# Patient Record
Sex: Female | Born: 1995 | Race: White | Hispanic: No | Marital: Single | Smoking: Never smoker
Health system: Southern US, Community
[De-identification: ages and names within clinical notes are randomized; demographics above are authoritative.]

## PROBLEM LIST (undated history)

## (undated) DIAGNOSIS — R41840 Attention and concentration deficit: Secondary | ICD-10-CM

## (undated) DIAGNOSIS — N946 Dysmenorrhea, unspecified: Secondary | ICD-10-CM

## (undated) DIAGNOSIS — Z8669 Personal history of other diseases of the nervous system and sense organs: Secondary | ICD-10-CM

## (undated) DIAGNOSIS — Z8619 Personal history of other infectious and parasitic diseases: Secondary | ICD-10-CM

## (undated) DIAGNOSIS — N760 Acute vaginitis: Secondary | ICD-10-CM

## (undated) DIAGNOSIS — F419 Anxiety disorder, unspecified: Secondary | ICD-10-CM

## (undated) DIAGNOSIS — F4323 Adjustment disorder with mixed anxiety and depressed mood: Secondary | ICD-10-CM

## (undated) HISTORY — DX: Personal history of other infectious and parasitic diseases: Z86.19

## (undated) HISTORY — DX: Adjustment disorder with mixed anxiety and depressed mood: F43.23

## (undated) HISTORY — DX: Personal history of other diseases of the nervous system and sense organs: Z86.69

## (undated) HISTORY — DX: Acute vaginitis: N76.0

## (undated) HISTORY — DX: Anxiety disorder, unspecified: F41.9

## (undated) HISTORY — DX: Attention and concentration deficit: R41.840

## (undated) HISTORY — DX: Dysmenorrhea, unspecified: N94.6

---
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
## 2013-10-14 ENCOUNTER — Encounter (HOSPITAL_COMMUNITY): Payer: Self-pay | Admitting: Psychiatry

## 2013-10-14 ENCOUNTER — Encounter (INDEPENDENT_AMBULATORY_CARE_PROVIDER_SITE_OTHER): Payer: Self-pay

## 2013-10-14 ENCOUNTER — Ambulatory Visit (INDEPENDENT_AMBULATORY_CARE_PROVIDER_SITE_OTHER): Payer: 59 | Admitting: Psychiatry

## 2013-10-14 VITALS — BP 107/73 | HR 89 | Ht 61.0 in | Wt 119.0 lb

## 2013-10-14 DIAGNOSIS — F411 Generalized anxiety disorder: Secondary | ICD-10-CM

## 2013-10-14 MED ORDER — VENLAFAXINE HCL ER 37.5 MG PO CP24: ORAL_CAPSULE | ORAL | Status: DC
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
## 2013-10-17 ENCOUNTER — Ambulatory Visit (INDEPENDENT_AMBULATORY_CARE_PROVIDER_SITE_OTHER): Payer: 59 | Admitting: Licensed Clinical Social Worker

## 2013-10-17 ENCOUNTER — Encounter (HOSPITAL_COMMUNITY): Payer: Self-pay | Admitting: Licensed Clinical Social Worker

## 2013-10-17 DIAGNOSIS — F411 Generalized anxiety disorder: Secondary | ICD-10-CM
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
## 2013-10-24 ENCOUNTER — Ambulatory Visit (INDEPENDENT_AMBULATORY_CARE_PROVIDER_SITE_OTHER): Payer: 59 | Admitting: Licensed Clinical Social Worker

## 2013-10-24 DIAGNOSIS — F411 Generalized anxiety disorder: Secondary | ICD-10-CM

## 2013-10-24 NOTE — Progress Notes (Signed)
   THERAPIST PROGRESS NOTE   THERAPIST PROGRESS NOTE   Date of Service:   10/24/2013  Session Time: 9:04AM-9:57AM  Patient:   Hannah Castro   DOB:   28-Aug-1995  MR Number:  [id number]  Location:  [facility] BEHAVIORAL HEALTH OUTPATIENT THERAPY [place] [id number] [place] [address] Dept: [phone]            Provider/Observer:  Mindi Curling Rithwik Schmieg Clinical Social Work  Risk of Suicide/Violence: low Patient denies SI with no intent or plan  Diagnosis:    Generalized anxiety disorder  Type of Therapy: Individual Therapy  Treatment Goals addressed: Anxiety  Participation Level: Active    Behavioral Observation: Hannah Castro  presents as a 18 y.o.-year-old who presents to therapy for symptoms of depression. [name] was neat and well groomed and made good eye contact throughout the session. She was drinking coffee and smiled as appropriate. Patients tone was steady in volume and pitch. Her behavior was congruent with her speech.   Interventions: Motivational Interviewing, Strength-based and Supportive Reviewed and signed treatment plan  Behavioral Response: Neat and Well GroomedAlertAnxious   Summary: patient discussed updates since previous sessions, relationship with family members, and goals. Patient reviewed and signed her treatment plan and reported that she may want to update it later. LCSW agreed. Patient discussed family relationships/dynamics and how it impacts her anxiety. Patient reported that she has had a "good few days" and she is hopeful that things will improve. Patient discussed her motivation for getting treatment and how she feels that she will make slow progress, but she will reach her goals. Patient reported that she would like to continue weekly sessions until she is more comfortable. LCSW listened actively, made reflective statements, and provided positive feedback as appropriate. LCSW signed the treatment plan and  reminded the patient that it is a fluid document and can be updated and reviewed as needed. LCSW assesses for SI and patient reported no current/recent thoughts of harming herself and reports that she has had a "pretty good" week.     Plan: Hannah Castro will write her "pet peeves" and triggers that lead to anxiety over the next week and will return to the session next week as scheduled to engage in therapy. Patient will take medication as prescribed by her physician.   Return again in 1 weeks.     Genice Rouge, [place] 10/24/2013

## 2013-10-31 ENCOUNTER — Ambulatory Visit (INDEPENDENT_AMBULATORY_CARE_PROVIDER_SITE_OTHER): Payer: 59 | Admitting: Licensed Clinical Social Worker

## 2013-10-31 DIAGNOSIS — F411 Generalized anxiety disorder: Secondary | ICD-10-CM

## 2013-10-31 NOTE — Progress Notes (Signed)
   THERAPIST PROGRESS NOTE  Session Time: 4:04PM-4:50PM  Participation Level: Active  Behavioral Response: Neat and Well GroomedAlertAnxious  Type of Therapy: Individual Therapy  Treatment Goals addressed: Anxiety Increase understanding of beliefs and messages that produce the worry and anxiety  Interventions: CBT and Motivational Interviewing  Summary: ALANYS GODINO is a 18 y.o. female who presents with symptoms of anxiety. Patient discussed her symptoms and presentation since the last session. Patient discussed her family and ongoing relationships that may be stressors in her life. Patient provided a list of her triggers and discussed the triggers of her anxiety and her reactions. Patient discussed her goals and how she would like to use her treatment to accomplish her goals to be able to reduce anxiety enough to be able to attend school next year and transfer to a four year university. Patient reports that she feels that she is adjusting to the medication well, and she is waiting for it to "kick in."  Suicidal/Homicidal: Nowithout intent/plan  Therapist Response: LCSW assessed the patients symptoms since her last session. LCSW assessed the patients motivation for treatment, listened actively and provided positive feedback as needed.   Plan: Return again in 1 weeks, take medications as prescribed, keep scheduled appointments.  Diagnosis: Axis I: Generalized Anxiety Disorder      Ronney Lion 10/31/2013

## 2013-10-31 NOTE — Progress Notes (Deleted)
Patient ID: Hannah Castro, female   DOB: 10/04/1995, 18 y.o.   MRN: [id number]

## 2013-11-11 ENCOUNTER — Encounter (HOSPITAL_COMMUNITY): Payer: Self-pay | Admitting: Psychiatry

## 2013-11-11 ENCOUNTER — Ambulatory Visit (INDEPENDENT_AMBULATORY_CARE_PROVIDER_SITE_OTHER): Payer: 59 | Admitting: Psychiatry

## 2013-11-11 VITALS — BP 128/88 | HR 98 | Ht 61.0 in | Wt 120.0 lb

## 2013-11-11 DIAGNOSIS — F411 Generalized anxiety disorder: Secondary | ICD-10-CM

## 2013-11-11 MED ORDER — VENLAFAXINE HCL ER 150 MG PO CP24: 150.0000 mg | ORAL_CAPSULE | Freq: Every day | ORAL | Status: DC
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
## 2013-11-18 ENCOUNTER — Ambulatory Visit (INDEPENDENT_AMBULATORY_CARE_PROVIDER_SITE_OTHER): Payer: 59 | Admitting: Licensed Clinical Social Worker

## 2013-11-18 DIAGNOSIS — F411 Generalized anxiety disorder: Secondary | ICD-10-CM

## 2013-11-18 NOTE — Progress Notes (Signed)
   THERAPIST PROGRESS NOTE  Session Time: 9:10AM-10:05AM  Participation Level: Active  Behavioral Response: Neat and Well GroomedAlertAnxious  Type of Therapy: Individual Therapy  Treatment Goals addressed: Anxiety and Coping Reduce overall level, frequency, and intensity of the anxiety so that daily functioning is not impaired   Interventions: Motivational Interviewing, Strength-based and Supportive  Summary: Hannah Castro is a 18 y.o. female who presents with symptoms of anxiety . Patient discussed her recent medication adjustment and the difficulties experienced while adjusting. Patient reports that she was vomiting and she had a headache when her medication was adjusted, but she has not experienced any difficulties since then. Patient discussed the coping skills that were assigned as homework and discussed the effective relaxation strategies. Patient discussed her history of anxiety while attending school and discussed her goals to reduce anxiety to return to school. Patient reports that she feels that the medication has helped and she has experienced less anxiety since the last session. Patient reports that she has never been to the dentist and reports that she has insurance, but is anxious about making an appointment.  Patient discussed the pros and cons of going to the dentis and reports that she could listen to music as a coping skill. Patient denies SI/HI and psychosis.  Suicidal/Homicidal: Nowithout intent/plan  Therapist Response: Monitor medication compliance and effectiveness. Assisted Hannah AmassCorey in developing positive coping strategies. LCSW assessed patients current symptoms and reviewed the patients homework. LCSW listened actively and asked probing questions. LCSW assessed the use of coping skills since last session. LCSW made reflective statements in reference to the patients emotions. LCSW discussed the plans for next session and encouraged the patient to continue to use her coping  skills.  LCSW assigned the patient the homework of researching dental practices and scheduling an appointment.    Plan: Return again in 2 weeks. Continue to take medications as prescribed, keep scheduled appointments and complete homework as assigned.   Diagnosis: Generalized Anxiety Disorder        Hannah Castro, Hannah Rehfeldt M, LCSW 11/18/2013

## 2013-11-28 ENCOUNTER — Ambulatory Visit (INDEPENDENT_AMBULATORY_CARE_PROVIDER_SITE_OTHER): Payer: 59 | Admitting: Licensed Clinical Social Worker

## 2013-11-28 DIAGNOSIS — F411 Generalized anxiety disorder: Secondary | ICD-10-CM

## 2013-11-28 NOTE — Progress Notes (Signed)
   THERAPIST PROGRESS NOTE  Session Time: 1:03PM  - 2:08Pm  Participation Level: Active  Behavioral Response: Neat and Well GroomedAlertAnxious  Type of Therapy: Individual Therapy  Treatment Goals addressed: Anxiety  Interventions: CBT, Motivational Interviewing and Supportive  Summary: Hannah Castro is a 18 y.o. female who presents with symptoms of anxiety. Patient discussed her symptoms since the last session. Patient reports that he has not felt as anxious, but she has not been very social. Patient discussed her sister's wedding and reports that she was cordial with her brother. Patient discussed her making an appointment to the dentist. Patient reports that she feels anxious about the appointment but she feels that she will do well.  Patient discussed her relationship with her boyfriend and how he is adopting his nephew. Patient discussed her anxiety associated with being a mother figure at a young age. Patient discussed how she feels that she is slowly becoming adjusted to having a baby as a part of her life. Patient discussed the various coping skills that she has used and reports that she continues to do breathing exercises. Patient reports that the most difficult aspect of being anxious is when she feels anxious without cause. Patient reports that she will take note of those times moving forward to examine the patterns and discuss possible coping skills for those moments. .   Suicidal/Homicidal: NAwithout intent/plan  Therapist Response: LCSW assessed the patients symptoms. LCSW listened actively, asked probing questions, and provided positive feedback as needed. LCSW discussed the strain in the patients relationship with her brother and assessed her anxiety level at her sisters wedding.  LCSW discussed the patients anxiety level when making her dentist appointment and discussed the anticipating anxiety being that her appointment is one month away. LCSW asked probing questions when  patient was discussing her relationship with her boyfriend and her anxiety related to being with an 2911 month old. LCSW assessed the patients current level of functioning and use of her coping skills and possibility of new coping skills. LCSW provided the patient with the "Managing Anxiety" packet and asked the patient to complete the packet for homework.    Plan: Return again in 1 weeks. Keep scheduled appointments, continue taking medication as scheduled, complete Managing Anxiety packet to discuss in the next session.   Diagnosis:  Generalized Anxiety Disorder      Genice RougeHerbin, Massimiliano Rohleder M, LCSW 11/28/2013

## 2013-12-05 ENCOUNTER — Ambulatory Visit (INDEPENDENT_AMBULATORY_CARE_PROVIDER_SITE_OTHER): Payer: 59 | Admitting: Licensed Clinical Social Worker

## 2013-12-05 DIAGNOSIS — F411 Generalized anxiety disorder: Secondary | ICD-10-CM

## 2013-12-05 NOTE — Progress Notes (Signed)
THERAPIST PROGRESS NOTE  Session Time: 9:05AM-10:00AM  Participation Level: Active  Behavioral Response: Neat and Well GroomedAlertAnxious  Type of Therapy: Individual Therapy  Treatment Goals addressed: Anxiety and Coping  Interventions: CBT, Motivational Interviewing, Strength-based and Supportive  Summary: Hannah Castro is a 18 y.o. female who presents with symptoms of anxiety. Patient completed her omework and reviewed the "Managing Anxiety" packet. Patient reviewed the packet and discussed what she had learned. Patient reports that she struggles with self care and negative self talk. Patient reports that she can "come out of it" but it is has been identified as a struggle. Patient reports that she has not had much anxiety lately and has been using her coping skills, ut she has isolated herself. Patient reports that she often becomes so anxious that she has symptoms of depression, with no thoughts of self harm, but has not experienced these feelings of depression lately. Patient reports that she has continued to build a relationship with her boyfriend and his nephew and discussed the role her anxiety and depression plays in that growing relationship. Patient discussed her future goals for the relationship as well as her goals for school and work as they relate to her diagnosis. Patient reports that she would be open to her boyfriend attending a session to discuss their relationship moving forward as it relates to her anxiety and other goals. Patient discussed barriers to achieving some of theses goals and possible ways to overcome these barriers. Patient denies SI/HI and psychosis during the session .   Suicidal/Homicidal: Nowithout intent/plan  Therapist Response: LCSW assessed patients symptoms since last session. LCSW reviewed the patients homework and discussed the findings. LCSW discussed self care and positive self talk as it relates to anxiety and depression. LCSW discussed the  coping skills that have worked for the patient and discussed consistent use of coping skills. LCSW discussed the patients ongoing anxiety about going to the dentist for the first time and how coping skills can assist in easing that anxiety. LCSW listened actively to the patient discussing her relationships and goals. LCSW offered positive feedback as needed and encouraged the patient to set small attainable goals. LCSW discussed couples therapy with the patient and encouraged the patient to think of things that she would want to discuss in a group setting to bring to the next session. LCSW provided a self care wheel for homework for the patient to think, write, and review her self care goals for all areas of her life. LCSW provided the patient with an additional homework assignment to set small attainable goals to reach one of her larger goals,  To discuss during the next session.  LCSW encouraged the patient to continue to work on her goals and use her coping skills. LCSW encouraged the patient to set boundaries, but avoid isolation to avoid being overwhelmed in the future. LCSW discussed the importance of learning, instead of avoiding triggers in treatment, to learn the necessary coping skills.  LCSW discussed her schedule changing due [place] and that the patient will continue to see the LCSW in this office.   Plan: Return again in 2 weeks, continue to take medication as prescribed, keep scheduled appointmentments, use coping skills, bring list of items to discuss in the couples session, review self care sheet, complete small goal sheet to discuss in the next session.   Diagnosis:  Generalized Anxiety Disorder        Genice RougeHerbin, Siddhartha Hoback M, LCSW 12/05/2013

## 2013-12-13 ENCOUNTER — Other Ambulatory Visit (HOSPITAL_COMMUNITY): Payer: Self-pay | Admitting: Psychiatry

## 2013-12-14 ENCOUNTER — Other Ambulatory Visit (HOSPITAL_COMMUNITY): Payer: Self-pay | Admitting: Psychiatry

## 2013-12-14 NOTE — Telephone Encounter (Signed)
Given on 11/11/13 with one refill. Too soon to refill.

## 2013-12-19 ENCOUNTER — Ambulatory Visit (INDEPENDENT_AMBULATORY_CARE_PROVIDER_SITE_OTHER): Payer: 59 | Admitting: Licensed Clinical Social Worker

## 2013-12-19 DIAGNOSIS — F411 Generalized anxiety disorder: Secondary | ICD-10-CM

## 2013-12-19 NOTE — Progress Notes (Signed)
   THERAPIST PROGRESS NOTE  Session Time: 9:07AM - 10:00AM  Participation Level: Active  Behavioral Response: Neat and Well GroomedAlertAnxious  Type of Therapy: Individual Therapy  Treatment Goals addressed: Anxiety and Coping  Interventions: Motivational Interviewing, Strength-based and Supportive  Summary: Hannah Castro is a 18 y.o. female who presents with symptoms of anxiety. Patient discussed her present symptoms and reports that she has noticed that she has difficulty holding conversations with others. Patient reprorts that she has heart palpitations and her anxiety increases when she has to engage in conversations with others. Patient reports that she has noticed an increase in this over the past few months. Patient discussed various things that she would like to address with her boyfriend in a joint session. Patient discussed the importance of using "I statements" and practiced some of those statements in the session.  Patient was assigned homework to rate her anxiety and track her conversations with others until next visit and also to practice writing "I statements" to review in the next session. Patient denies SI/HI and psychosis during the session.   .   Suicidal/Homicidal: Nowithout intent/plan   Therapist Response: LCSW assessed the patients symptoms and asked patient for her homework that was assigned during the last session. LCSW assessed the patients anxiety level and the most recent issues. LCSW listened actively and provided positive feedback as needed. LCSW asked probing questions and gained insight into the patients current problems. LCSW discussed the patients boyfriend attending a session and asked about the expectations of the session. LCSW informed the patient that the sessions will be planned and goal oriented. The LCSW assigned patient homework to practice writing "I statements" to address her concerns with her boyfriend during the next session.  LCSW assigned the  patient homework to address the level of anxiety during conversations for the next session.    Plan: Return again in 2 weeks, take medications as prescribed, keep scheduled appointments, complete homework as assigned. .  Diagnosis:  Generalized Anxiety Disorder        Hannah RougeHerbin, Hannah Castro M, LCSW 12/19/2013

## 2014-01-02 ENCOUNTER — Other Ambulatory Visit (HOSPITAL_COMMUNITY): Payer: Self-pay | Admitting: Psychiatry

## 2014-01-02 ENCOUNTER — Ambulatory Visit (INDEPENDENT_AMBULATORY_CARE_PROVIDER_SITE_OTHER): Payer: 59 | Admitting: Licensed Clinical Social Worker

## 2014-01-02 DIAGNOSIS — F411 Generalized anxiety disorder: Secondary | ICD-10-CM

## 2014-01-02 NOTE — Progress Notes (Signed)
   THERAPIST PROGRESS NOTE  Session Time: 9:03am-9:56am  Participation Level: Active  Behavioral Response: Neat and Well GroomedAlertAnxious  Type of Therapy: Individual Therapy  Treatment Goals addressed: Anxiety and Communication: using "I" Statements  Interventions: CBT, Motivational Interviewing and Supportive  Summary: Hannah AmabileCorey E Castro is a 18 y.o. female who presents with symptoms of anxiety. Patient reports that she has been feeling less anxiety when communicating with others and reviewed her homework assignment. Patient reports that she has been able to communicate and have "small talk" which has increased her comfort in speaking with others. Patient discussed her homework related to "I statements" and role played using those "I statements" with her boyfriend. Patient discussed the topics that she would like to cover in the session with her boyfriend and how the various issues result in increased anxiety. Patient reports comfort using the "I statements" and reports that she feels that she is prepared to have Hannah Castro join in the next session. PAtient signed the appropriate releases. Patient reports that she is taking her medication as prescribed with no complaints. Patient reports that she went to her dentist appointments and she had good results, patient reports that she was anxious while in the waiting room, but grew comfortable once she was back in the office. Patient reports that she will see a dentist regularly moving forward.  Patient reports that she uses some coping skills, but has not been triggered as of late. Patient denies SI/HI and psychosis.    Suicidal/Homicidal: Nowithout intent/plan  Therapist Response: LCSW assessed the patients symptoms and reviewed homework. LCSW listened actively and asked probing questions regarding the patients anxiety level as it relates to communicating with others. LCSW reviewed and discussed the patients "I statements" and participated in role play.  LCSW reviewed the rules of family sessions and discussed the overall purpose of the session. LCSW encouraged the patient to establish a purpose for the family session and to have "I statements" that reflect that purpose in an effort to keep the conversation on the topic as it relates to her anxiety and recovery. LCSW encouraged the patient to use the coping skills regularly despite lack of triggers. LCSW inquired about the patients visit to the dentist and praised the patient for following through with the appointment with reduced anxiety. LCSW prepared the patient for a family session at the next appointment.    Plan: Return again in 2 weeks, use coping skills, prepare to use " I statements" in a family session, keep scheduled appointments and take medications as prescribed.   Diagnosis:  Generalized Anxiety Disorder        Ronney LionHerbin, Hannah Cavalieri M, LCSW 01/02/2014

## 2014-01-02 NOTE — Progress Notes (Deleted)
   THERAPIST PROGRESS NOTE  Session Time: ***  Participation Level: {BHH PARTICIPATION LEVEL:22264}  Behavioral Response: {Appearance:22683}{BHH LEVEL OF CONSCIOUSNESS:22305}{BHH MOOD:22306}  Type of Therapy: {CHL AMB BH Type of Therapy:21022741}  Treatment Goals addressed: {CHL AMB BH Treatment Goals Addressed:21022754}  Interventions: {CHL AMB BH Type of Intervention:21022753}  Summary: Hannah Castro is a 18 y.o. female who presents with ***.   Suicidal/Homicidal: {BHH YES OR NO:22294}{yes/no/with/without intent/plan:22693}  Therapist Response: ***  Plan: Return again in *** weeks.  Diagnosis: Axis I: {psych axis 1:31909}    Axis II: {psych axis 2:31910}    Genice RougeHerbin, Raechelle Sarti M, LCSW 01/02/2014

## 2014-01-07 ENCOUNTER — Other Ambulatory Visit (HOSPITAL_COMMUNITY): Payer: Self-pay | Admitting: Psychiatry

## 2014-01-09 ENCOUNTER — Other Ambulatory Visit (HOSPITAL_COMMUNITY): Payer: Self-pay | Admitting: Psychiatry

## 2014-01-09 NOTE — Telephone Encounter (Signed)
Patient had appointment in few days. Will wait

## 2014-01-12 ENCOUNTER — Encounter (HOSPITAL_COMMUNITY): Payer: Self-pay | Admitting: Psychiatry

## 2014-01-12 ENCOUNTER — Ambulatory Visit (INDEPENDENT_AMBULATORY_CARE_PROVIDER_SITE_OTHER): Payer: 59 | Admitting: Psychiatry

## 2014-01-12 VITALS — BP 109/71 | HR 103 | Ht 61.0 in | Wt 119.8 lb

## 2014-01-12 DIAGNOSIS — F411 Generalized anxiety disorder: Secondary | ICD-10-CM

## 2014-01-12 MED ORDER — VENLAFAXINE HCL ER 150 MG PO CP24: 150.0000 mg | ORAL_CAPSULE | Freq: Every day | ORAL | Status: DC
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
## 2014-01-13 ENCOUNTER — Ambulatory Visit (INDEPENDENT_AMBULATORY_CARE_PROVIDER_SITE_OTHER): Payer: 59 | Admitting: Licensed Clinical Social Worker

## 2014-01-13 DIAGNOSIS — F411 Generalized anxiety disorder: Secondary | ICD-10-CM

## 2014-01-13 NOTE — Progress Notes (Signed)
   THERAPIST PROGRESS NOTE  Session Time: 9:07AM-10:01AM   Participation Level: Active  Behavioral Response: Neat and Well GroomedAlertAnxious  Type of Therapy: Individual Therapy  Treatment Goals addressed: Anxiety, Communication: with her boyfriend and his family and Coping  Interventions: Motivational Interviewing, Strength-based and Supportive  Summary: Hannah Castro is a 18 y.o. female who presents with symptoms of anxiety. Patient was present with her boyfriend to discuss how adopting his nephew impacts her anxiety and discuss plans for moving forward. Patients boyfriend was attentive and encouraging. Patient discussed how gaining custody of her boyfriend's nephew contributes to her anxiety and discussed a plan for moving forward. Patient reports that she is much more co fident in their relationship and needs to prepare for going back to school. Patient's boyfriend reports that the joint session was beneficial for him and he is glad they had an opportunity to communicate with one another about a plan.  Patient denies SI/HI and psychosis.   Suicidal/Homicidal: Nowithout intent/plan  Therapist Response: LCSW assessed the patients symptoms and set the ground rules for the family session. LCSW asked the patient to express herself using the "I statements" practiced in previous settings. LCSW encouraged the patient and her boyfriend to be honest during the session. LCSW supported the patient in the session and summarized the session to ensure that both parties left with the same plan. LCSW checked in with the patient and her boyfriend before the session ended and encouraged more sessions in the future if needed to stabilize or reducethe patient's anxiety. LCSW provided teh patients with parenting books to review skills and make copies for their records.   Plan: Return again in 1 weeks to discuss the session with the patient only, continue to take medications as prescribed, use coping skills, and  keep scheduled appointments. .  Diagnosis: Generalized Anxiety Disorder   Hannah LionHerbin, Hannah Wahlert M, LCSW 01/13/2014

## 2014-01-27 ENCOUNTER — Ambulatory Visit (HOSPITAL_COMMUNITY): Payer: Self-pay | Admitting: Licensed Clinical Social Worker

## 2014-02-03 ENCOUNTER — Ambulatory Visit (INDEPENDENT_AMBULATORY_CARE_PROVIDER_SITE_OTHER): Payer: 59 | Admitting: Licensed Clinical Social Worker

## 2014-02-03 DIAGNOSIS — F411 Generalized anxiety disorder: Secondary | ICD-10-CM

## 2014-02-03 NOTE — Progress Notes (Signed)
   THERAPIST PROGRESS NOTE  Session Time: 2:00-2:55PM  Participation Level: Active  Behavioral Response: Neat and Well GroomedAlertAnxious  Type of Therapy: Individual Therapy  Treatment Goals addressed: Anxiety and Coping  Interventions: CBT, Motivational Interviewing, Strength-based and Supportive  Summary: Hannah Castro is a 18 y.o. female who presents with symptoms of anxiety. Patient reports that she has not had many symptoms of anxiety lately and has been taking her medications as prescribed. Patient reports that she has been doing well since the last session and has signed up for classes and is preparing for school. Patient discussed her anxiety related to returning to school and discussed the problems with anxiety that she encountered during her last semester at school. Patient discussed her preparation to return to school.  Patient discussed her plans to return to school and the skills that she would like to use to manage her anxiety. Patient reports that she would be ready to discuss moving to monthly sessions in preparation for discharge. Patient denies SI/HI and psychosis.    Suicidal/Homicidal: Nowithout intent/plan  Therapist Response: LCSW assessed patients current symptoms and compliance with medication. LCSW followed up with patient with topics discussed in the last session and asked if the family session impacted her relationship any. LCSW asked the patient about her anxiety experiences as they related to attending school and how her anxiety has impacted her education in the past. LCSW listened actively and asked probing questions to the patient and asked the patient what she feels that she could change moving forward into a new semester. LCSW asked the patient how she had prepared for returning to school and how this preparation differs from her previous preparations. LCSW discussed the pros and cons of transitioning to a four year university after finishing her associates  degree. LCSW discussed the options available to manage her anxiety.   Plan: Return again in 2 weeks, use coping skills,   Diagnosis:  Generalized Anxiety Disorder       Genice RougeHerbin, Eline Geng M, LCSW 02/03/2014

## 2014-02-17 ENCOUNTER — Ambulatory Visit (INDEPENDENT_AMBULATORY_CARE_PROVIDER_SITE_OTHER): Payer: 59 | Admitting: Licensed Clinical Social Worker

## 2014-02-17 DIAGNOSIS — F411 Generalized anxiety disorder: Secondary | ICD-10-CM

## 2014-02-17 NOTE — Progress Notes (Signed)
   THERAPIST PROGRESS NOTE  Session Time:9:00AM-9:55AM  Participation Level: Active  Behavioral Response: Neat and Well GroomedAlertAnxious  Type of Therapy: Individual Therapy  Treatment Goals addressed: Anxiety and Coping  Interventions: CBT, Motivational Interviewing, Strength-based and Supportive  Summary: Hannah Castro is a 18 y.o. female who presents with a history of anxiety. Patient reports that she has not had many symptoms of anxiety over the past two weeks and she has been taking her medications as prescribed. Patient reports that she completed her goal sheet and she does not feel as overwhelmed since she has broken her goal down into smaller steps. Patient discussed returning to school in two weeks and reports that she feels that she is prepared. Patient reports that she spoke with her advisor and she is planning to finish her associates over the summer and apply to [employer] for next year.  Patient reports that she has the disability paperwork printed out and would like to submit the paperwork to campus to reduce the amount of anxiety that she has while in school. Patient discussed her triggers while in school and taking the necessary precautions to avoid them.  Patient reports that her anxiety has been a "3" on a scale of 1-10 over the past week. Patient reports that she struggles with biting and picking her nails, and although her anxiety has reduced, she still has this habit. Patient reports that she feels that her anxiety has reduced dramatically. Patient requested that we meet in two weeks before going to monthly appointments. Patient denies SI/HI and psychosis.    Suicidal/Homicidal: Nowithout intent/plan  Therapist Response: LCSW assessed the patients symptoms and followed up on the patients homework assignment from last week.  LCSW asked patient about her compliance with medications. LCSW followed up with patient regarding her goals and being overwhelmed with reaching her goal of  returning to school while managing her anxiety. LCSW listened actively and provided positive feedback as needed.   LCSW followed up with patient regarding disability paperwork for this coming semester. LCSW discussed patient anxiety level and her goals as they relate to discharge. LCSW encouraged the patient to continue to use her skills to reduce her level of anxiety. LCSW informed patient that she will look into methods to assist with nail biting/picking. LCSW also encouraged patient to speak with her sister who has thad the same habit in the past.   Plan: Return again in 2 weeks.  Diagnosis:  Generalized Anxiety Disorder      Ronney LionHerbin, Dietrich Ke M, LCSW 02/17/2014

## 2014-03-02 ENCOUNTER — Encounter (HOSPITAL_COMMUNITY): Payer: Self-pay | Admitting: Licensed Clinical Social Worker

## 2014-03-06 ENCOUNTER — Ambulatory Visit (INDEPENDENT_AMBULATORY_CARE_PROVIDER_SITE_OTHER): Payer: 59 | Admitting: Licensed Clinical Social Worker

## 2014-03-06 DIAGNOSIS — F411 Generalized anxiety disorder: Secondary | ICD-10-CM

## 2014-03-06 NOTE — Progress Notes (Unsigned)
   THERAPIST PROGRESS NOTE  Session Time: 2:05PM-2:58PM  Participation Level: Active  Behavioral Response: Neat and Well GroomedAlertAnxious  Type of Therapy: Individual Therapy  Treatment Goals addressed: Anxiety and Coping  Interventions: CBT, Supportive and Reframing  Summary: Hannah Castro is a 19 y.o. female who presents with symptoms of anxiety. Patient discussed her symptoms and her transition to school. Patient reports that she feels that she is adjusting well and has decided not to submit the disability forms for school. 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 Patient expressed concerns about a baby that she knows. [place] who reported that they could not take that call and directed the patient to contact non-emergency. Patient contacted non-emergency, who reported that this was not an emergency and provided a phone number that the patient could report. Patient reports she is concerned about the way someone she knows had the baby in the car seat because she did not feel that it was secured correctly. Patient discussed her anxiety related to this and she talked through it. Patient reports that she feels that she will have to "sit" with feeling this way about the car seat and accept that she did all that she was able to do.  Patient reports that she will travel to meet the LCSW in the new location in one month. Patient denies SI/HI and psychosis.   Suicidal/Homicidal: Nowithout intent/plan  Therapist Response: LCSW assessed patient for symptoms and followed up with patient about starting school and disability. LCSW listened actively and encouraged patient to continue to talk things through to become less anxious. LCSW assisted patient in contacting DSS and non-emergency and discussed the patient's anxious feelings in the session. LCSW discussed how the patient could cope with  this instance and how it relates to other instances of coping with anxiety. LCSW informed patient that she would be switching locations and offered various options for continued care.    Plan: Return again in 4 weeks.  Diagnosis:  Generalized Anxiety Disorder      Ronney LionHerbin, Terrence Wishon M, LCSW 03/06/2014

## 2014-04-01 ENCOUNTER — Other Ambulatory Visit (HOSPITAL_COMMUNITY): Payer: Self-pay | Admitting: Psychiatry

## 2014-04-02 NOTE — Telephone Encounter (Signed)
Refill requested early. Will be filled at appointment 04/14/14.

## 2014-04-06 NOTE — Telephone Encounter (Signed)
Medication refill - Effexor XR last written 01/12/14 with 2 refills - appointment 04/14/14

## 2014-04-14 ENCOUNTER — Ambulatory Visit (INDEPENDENT_AMBULATORY_CARE_PROVIDER_SITE_OTHER): Payer: 59 | Admitting: Psychiatry

## 2014-04-14 ENCOUNTER — Encounter (HOSPITAL_COMMUNITY): Payer: Self-pay | Admitting: Psychiatry

## 2014-04-14 VITALS — BP 102/70 | HR 125 | Wt 114.6 lb

## 2014-04-14 DIAGNOSIS — F411 Generalized anxiety disorder: Secondary | ICD-10-CM

## 2014-04-14 MED ORDER — CLONAZEPAM 0.5 MG PO TABS: ORAL_TABLET | ORAL | Status: DC

## 2014-04-14 MED ORDER — CLONAZEPAM 0.5 MG PO TABS: 0.2500 mg | ORAL_TABLET | Freq: Every day | ORAL | Status: DC

## 2014-04-14 MED ORDER — VENLAFAXINE HCL ER 150 MG PO CP24: 150.0000 mg | ORAL_CAPSULE | Freq: Every day | ORAL | Status: DC
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
## 2014-04-24 ENCOUNTER — Ambulatory Visit (INDEPENDENT_AMBULATORY_CARE_PROVIDER_SITE_OTHER): Payer: 59 | Admitting: Psychology

## 2014-04-24 DIAGNOSIS — F411 Generalized anxiety disorder: Secondary | ICD-10-CM
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
## 2014-05-26 ENCOUNTER — Ambulatory Visit (INDEPENDENT_AMBULATORY_CARE_PROVIDER_SITE_OTHER): Payer: 59 | Admitting: Psychology

## 2014-05-26 DIAGNOSIS — F411 Generalized anxiety disorder: Secondary | ICD-10-CM

## 2014-05-26 NOTE — Progress Notes (Signed)
   THERAPIST PROGRESS NOTE  Session Time: 1.30pm-2.30pm  Participation Level: Active  Behavioral Response: Well GroomedAlertAnxious  Type of Therapy: Individual Therapy  Treatment Goals addressed: Diagnosis: GAD and goal 1.  Interventions: CBT, Supportive and Other: stress managment  Summary: Hannah Castro is a 19 y.o. female who presents with report of increased anxiety in the past month.  Pt is very fidgety initally in session and present w/ anxious affect.  Pt reported that she has had 3 panic attacks in the last month. Pt reported that she has been stressed w/ academic projects, car break down, stressed w/ supports all of of town, and worry about child she is helping as primary caregiver.  Pt discussed dynamics of caregiving for her boyfriend's biological nephew who is placed w/ his parents in kinship care arrangement.  Pt reports that the plan is for her boyfriend to work towards adoption of him and that she is in caregiver role of "mom" but doesn't feel like legally has any standing to make decisions for his care and finds frustrating.  Pt is able to acknowledge that during spring her anxiety typcially increases and needs to focus on stress management and daily activities to assist w/ emotional regulation.  Pt likes the idea of daily activity for self to unwind that isn't sleeping and discussed how was nice the other day to do this siting in chair to have a moment to relax prior to school work.  Pt also reports she is being more aware of her body and holding tension and trying to intentionally release.     Suicidal/Homicidal: Nowithout intent/plan  Therapist Response: Assessed pt current functioning per pt report.  Processed w/pt increased anxiety and contributing factors.  Validated pt feelings re: role of caregiver.  Assisted pt in acknowledged positive self care and other ways she can further focus on her positive self care w/ stress management and daily activities to release stress and  tension.   Plan: Return again in 2 weeks.  Pt to participate in a daily activity of self care for releasing stress.  Diagnosis: GAD    Krissy Orebaugh, [facility] 05/26/2014

## 2014-06-11 LAB — CBC WITH DIFFERENTIAL/PLATELET
BASOS ABS: 0 10*3/uL (ref 0.0–0.1)
Basophils Relative: 1 % (ref 0–1)
EOS ABS: 0 10*3/uL (ref 0.0–0.7)
EOS PCT: 0 % (ref 0–5)
HCT: 40.5 % (ref 36.0–46.0)
Hemoglobin: 13.4 g/dL (ref 12.0–15.0)
Lymphocytes Relative: 39 % (ref 12–46)
Lymphs Abs: 1.6 10*3/uL (ref 0.7–4.0)
MCH: 30 pg (ref 26.0–34.0)
MCHC: 33.1 g/dL (ref 30.0–36.0)
MCV: 90.6 fL (ref 78.0–100.0)
MONO ABS: 0.4 10*3/uL (ref 0.1–1.0)
MPV: 10.1 fL (ref 8.6–12.4)
Monocytes Relative: 9 % (ref 3–12)
Neutro Abs: 2.1 10*3/uL (ref 1.7–7.7)
Neutrophils Relative %: 51 % (ref 43–77)
PLATELETS: 212 10*3/uL (ref 150–400)
RBC: 4.47 MIL/uL (ref 3.87–5.11)
RDW: 13.8 % (ref 11.5–15.5)
WBC: 4.2 10*3/uL (ref 4.0–10.5)

## 2014-06-12 LAB — LIPID PANEL
Cholesterol: 180 mg/dL (ref 0–200)
HDL: 69 mg/dL (ref >=46)
LDL Cholesterol: 98 mg/dL (ref 0–99)
Total CHOL/HDL Ratio: 2.6 Ratio
Triglycerides: 64 mg/dL (ref <150)
VLDL: 13 mg/dL (ref 0–40)

## 2014-06-12 LAB — COMPLETE METABOLIC PANEL WITH GFR
ALBUMIN: 4.5 g/dL (ref 3.5–5.2)
ALT: 10 U/L (ref 0–35)
AST: 19 U/L (ref 0–37)
Alkaline Phosphatase: 57 U/L (ref 39–117)
BUN: 11 mg/dL (ref 6–23)
CALCIUM: 9.6 mg/dL (ref 8.4–10.5)
CO2: 29 meq/L (ref 19–32)
CREATININE: 0.73 mg/dL (ref 0.50–1.10)
Chloride: 100 mEq/L (ref 96–112)
GFR, Est Non African American: >89 mL/min
GLUCOSE: 96 mg/dL (ref 70–99)
Potassium: 4.4 mEq/L (ref 3.5–5.3)
Sodium: 140 mEq/L (ref 135–145)
Total Bilirubin: 0.5 mg/dL (ref 0.2–1.2)
Total Protein: 7.4 g/dL (ref 6.0–8.3)

## 2014-06-12 LAB — HEMOGLOBIN A1C
HEMOGLOBIN A1C: 5.2 % (ref <5.7)
Mean Plasma Glucose: 103 mg/dL (ref <117)

## 2014-06-12 LAB — TSH: TSH: 0.663 u[IU]/mL (ref 0.350–4.500)

## 2014-06-30 ENCOUNTER — Ambulatory Visit (HOSPITAL_COMMUNITY): Payer: Self-pay | Admitting: Psychology

## 2014-06-30 ENCOUNTER — Encounter (HOSPITAL_COMMUNITY): Payer: Self-pay | Admitting: Psychology

## 2014-06-30 NOTE — Progress Notes (Signed)
Hannah Castro is a 19 y.o. female patient who didn't show for her 12:30pm appointment.  No show letter sent.         Forde RadonYATES,LEANNE, LPC

## 2014-07-09 ENCOUNTER — Other Ambulatory Visit (HOSPITAL_COMMUNITY): Payer: Self-pay | Admitting: Psychiatry

## 2014-07-10 NOTE — Telephone Encounter (Signed)
Appointment on 07/14/14

## 2014-07-13 ENCOUNTER — Ambulatory Visit (HOSPITAL_COMMUNITY): Payer: Self-pay | Admitting: Psychiatry

## 2014-07-14 ENCOUNTER — Ambulatory Visit (INDEPENDENT_AMBULATORY_CARE_PROVIDER_SITE_OTHER): Payer: 59 | Admitting: Psychology

## 2014-07-14 ENCOUNTER — Ambulatory Visit (HOSPITAL_COMMUNITY): Payer: Self-pay | Admitting: Psychiatry

## 2014-07-14 DIAGNOSIS — F411 Generalized anxiety disorder: Secondary | ICD-10-CM | POA: Diagnosis not present

## 2014-07-14 NOTE — Psych (Signed)
   THERAPIST PROGRESS NOTE  Session Time: 1.30pm-2.18pm  Participation Level: Active  Behavioral Response: Well GroomedAlert, pt reported tired  Type of Therapy: Individual Therapy  Treatment Goals addressed: Diagnosis: GAD and goal 1.  Interventions: CBT, Supportive and Other: mindful movement  Summary: Hannah Castro is a 19 y.o. female who presents seeming irritable- however reports that she is just tired as long day w/ 2 classes and still another class this evening. Pt reported that spring semester ended well w/ 2As and 2Bs.  Pt reported she started her 8 week summer classes including college transfer course, Intro to [place] [employer] and art 111.  Pt reported other then schedule of classes feels good about this semester. Pt reports plans on completing application for transfer to university in July.  Pt reported that still stressors w/ caregiving in kinship care arrangement and parent sending police to the home to seek custody.  Pt reports focused on present and not getting caught up in worry of legal processes ahead re: guardianship.  Pt reported that she is bothered by occasional involuntary twitches in legs and origin despite several professionals told her related to her anxiety.  Pt agreed to use of body intentional and mindful body movements as skill for mind body wellbeing.    Suicidal/Homicidal: Nowithout intent/plan  Therapist Response: Assessed pt current functioning per pt report. REfelcted affect and explored contributing factors to fatigue today.  Processed w/pt stressors and pt coping through w/ use of mindfulness.  Explored w/ pt benefits of body movements for coping through mind body integration.   Plan: Return again in 2 weeks.  Diagnosis:Generalized Anxiety Disorder      Forde RadonYATES,LEANNE, [facility] 07/14/2014

## 2014-07-21 ENCOUNTER — Other Ambulatory Visit (HOSPITAL_COMMUNITY): Payer: Self-pay | Admitting: Psychiatry

## 2014-07-21 ENCOUNTER — Telehealth (HOSPITAL_COMMUNITY): Payer: Self-pay

## 2014-07-21 DIAGNOSIS — F411 Generalized anxiety disorder: Secondary | ICD-10-CM

## 2014-07-21 NOTE — Telephone Encounter (Signed)
Medication refill request for Klonopin [facility] - last ordered 04/14/14 with one refill.  Patient was cancelled from 07/13/14 due to provider out.  Returns on 08/06/14.

## 2014-07-21 NOTE — Telephone Encounter (Signed)
Okay to refill for 30 days

## 2014-07-22 MED ORDER — CLONAZEPAM 0.5 MG PO TABS: ORAL_TABLET | ORAL | Status: DC

## 2014-07-22 NOTE — Telephone Encounter (Signed)
[facility].  Spoke with Thayer Ohmhris. Called in Klonopin 0.5 mg, 1-1/2 tablet by mouth once daily as needed for anxiety..with zero refills

## 2014-07-23 ENCOUNTER — Telehealth (HOSPITAL_COMMUNITY): Payer: Self-pay | Admitting: *Deleted

## 2014-07-23 ENCOUNTER — Other Ambulatory Visit (HOSPITAL_COMMUNITY): Payer: Self-pay | Admitting: Psychiatry

## 2014-07-23 NOTE — Telephone Encounter (Signed)
Pt has follow up 08-06-14.

## 2014-07-23 NOTE — Telephone Encounter (Signed)
Pt RX printed.  Called in RX for Effexor.  Void printed RX.

## 2014-08-03 ENCOUNTER — Ambulatory Visit (INDEPENDENT_AMBULATORY_CARE_PROVIDER_SITE_OTHER): Payer: 59 | Admitting: Psychology

## 2014-08-03 DIAGNOSIS — F411 Generalized anxiety disorder: Secondary | ICD-10-CM | POA: Diagnosis not present

## 2014-08-04 NOTE — Progress Notes (Signed)
   THERAPIST PROGRESS NOTE  Session Time: 1.40pm-2.30pm  Participation Level: Active  Behavioral Response: Well GroomedAlertAnxious  Type of Therapy: Individual Therapy  Treatment Goals addressed: Diagnosis: GAD and goal 1.  Interventions: CBT and Supportive  Summary: Hannah Castro is a 19 y.o. female who presents with initial guarded and irritated affect.  Pt reported that she has some leg pain today that is bothering her.  Pt later disclosed upset w/ "[place]" for difficulty getting her medication refilled. Pt was able to identify that not upset at particular person but that the process and lack of f/u she felt she received.  Pt increased her awareness of f/u if needed with office if seems like not resolving in the future.  Pt reported that she has had some recent positive interactions w/ "playdates" and kid birthday parties upcoming. Pt reported that she did experience a recent panic attack when electronically "lost" her paper that was due that day.  Pt discussed how she sought counseling at [facility] counseling center and that this was positive experience. Pt linked that movement of walking that day was beneficial and how to use movement as coping skill for her anxiety .   Suicidal/Homicidal: Nowithout intent/plan  Therapist Response: Assessed pt current functioning per pt report.  Reflected to pt affect and encouraged pt to express concerns.  Encouraged f/u contact w/ office if experiences problems in medication refill process in future.  Explored w/pt her social interactions and reflected as positive and progress.  Processed w/pt her anxiety attack and what was helpful in coping.  Discussed movement as both important for self care and coping skill.   Plan: Return again in 2 weeks.  Diagnosis: GAD   Forde Radon, [facility] 08/04/2014

## 2014-08-06 ENCOUNTER — Ambulatory Visit (INDEPENDENT_AMBULATORY_CARE_PROVIDER_SITE_OTHER): Payer: 59 | Admitting: Psychiatry

## 2014-08-06 ENCOUNTER — Encounter (HOSPITAL_COMMUNITY): Payer: Self-pay | Admitting: Psychiatry

## 2014-08-06 VITALS — BP 96/64 | HR 114 | Ht 61.0 in | Wt 108.0 lb

## 2014-08-06 DIAGNOSIS — F411 Generalized anxiety disorder: Secondary | ICD-10-CM | POA: Diagnosis not present

## 2014-08-06 MED ORDER — VENLAFAXINE HCL ER 150 MG PO CP24: 150.0000 mg | ORAL_CAPSULE | Freq: Every day | ORAL | Status: DC
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
## 2014-08-26 ENCOUNTER — Ambulatory Visit (INDEPENDENT_AMBULATORY_CARE_PROVIDER_SITE_OTHER): Payer: 59 | Admitting: Psychology

## 2014-08-26 DIAGNOSIS — F411 Generalized anxiety disorder: Secondary | ICD-10-CM | POA: Diagnosis not present

## 2014-08-26 NOTE — Progress Notes (Signed)
   THERAPIST PROGRESS NOTE  Session Time: 1.34pm-2.26pm  Participation Level: Active  Behavioral Response: Well GroomedAlert, Stressed  Type of Therapy: Individual Therapy  Treatment Goals addressed: Diagnosis: GAD and goal 1.  Interventions: CBT and Supportive  Summary: Hannah Castro is a 19 y.o. female who presents with report of feeling stressed and tired.  Pt reported that it is the end of her summer session and a lot of work to accomplish in the next week.  Pt reported that she might not pass one class- but acknowledges her role in this. Pt reported that she is doing well in other 2 classes. Pt reported she is completing her application for [facility] to [employer].  Pt reported that also feels stressed in her relationship- feels that he is relying on her to be "ok" and that she feels guilty for staying at her house and focusing on taking care of self even if for a day.  Pt reported that has been nice to be at home for past couple of days and complete her school work and have down time w/her pets and family. Pt acknowledged that ok to set boundaries and healthy for her attend to her needs and can be supportive.  Pt feels that she is asserting and communicating this.   Suicidal/Homicidal: Nowithout intent/plan  Therapist Response: Assessed pt current functioning per pt report.  Processed w/pt her stressors and explored w/pt her self care and time management for coping.  Encouraged pt to find opportunities for self care and effectively communicating in relationship.    Plan: Return again in 2 weeks.  Diagnosis: GAD    YATES,LEANNE, LPC 08/26/2014

## 2014-09-04 ENCOUNTER — Other Ambulatory Visit (HOSPITAL_COMMUNITY): Payer: Self-pay | Admitting: Psychiatry

## 2014-09-04 DIAGNOSIS — F411 Generalized anxiety disorder: Secondary | ICD-10-CM

## 2014-09-07 NOTE — Telephone Encounter (Signed)
Telephone call from [place], pharmacist at [facility] requesting a refill of patient's Clonazepam as patient is in their store waiting.  Met with Dr. Adele Schilder who approved refill and called in one time refill order of patient's Clonazepam 0.1m 1/2-1 daily prn for anxiety as Dr. AAdele Schilderapproved.

## 2014-09-09 ENCOUNTER — Ambulatory Visit (HOSPITAL_COMMUNITY): Payer: Self-pay | Admitting: Psychology

## 2014-09-23 ENCOUNTER — Ambulatory Visit (INDEPENDENT_AMBULATORY_CARE_PROVIDER_SITE_OTHER): Payer: 59 | Admitting: Psychology

## 2014-09-23 DIAGNOSIS — F411 Generalized anxiety disorder: Secondary | ICD-10-CM | POA: Diagnosis not present
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
## 2014-10-07 ENCOUNTER — Ambulatory Visit (INDEPENDENT_AMBULATORY_CARE_PROVIDER_SITE_OTHER): Payer: 59 | Admitting: Psychology

## 2014-10-07 DIAGNOSIS — F411 Generalized anxiety disorder: Secondary | ICD-10-CM | POA: Diagnosis not present

## 2014-10-07 NOTE — Progress Notes (Signed)
   THERAPIST PROGRESS NOTE  Session Time: 1.35pm-2:28pm.  Participation Level: Active  Behavioral Response: Well GroomedAlertTired  Type of Therapy: Individual Therapy  Treatment Goals addressed: Diagnosis: GAD and goal 1.  Interventions: CBT, Motivational Interviewing and Supportive  Summary: Hannah Castro is a 19 y.o. female who presents with report of feeling really tired today.  Pt reported that she had orientation for [employer] yesterday and "it was a long day".  Pt reported registered for classes but many classes wanted were closed- will attempt to get override for one.  Pt reports that also felt discouraged b/c only considered sophomore as some classes didn't transfer. Pt also reported other road blocks w/ immunization records.  Pt reported that stressors of relationship hasn't changed- "annoyed" by boyfriends interactions and not feeling that things have changed.  Pt verbalized she is ready for change w/ re: to relationship- but not ready to face impact of making a change.  Pt reported that she has been taking more time for self.  Pt discussed initially had planned on visiting boyfriend this afternoon but now doesn't feel "up to it'.  Pt discussed that would feel better to further explored and prepare for school start next week.   Suicidal/Homicidal: Nowithout intent/plan  Therapist Response: Assessed pt current functioning per pt report.  Processed w/pt stressors and contributing factors to fatigue.  Discussed roadblocks and pt ability to navigate.  Explored w/pt interactions w/ boyfriend.  Discussed want and readiness for change and barriers towards change.   Plan: Return again in 2 weeks.  Diagnosis: GAD   Forde Radon, [facility] 10/07/2014

## 2014-10-08 ENCOUNTER — Other Ambulatory Visit (HOSPITAL_COMMUNITY): Payer: Self-pay | Admitting: Psychiatry

## 2014-10-08 DIAGNOSIS — F411 Generalized anxiety disorder: Secondary | ICD-10-CM

## 2014-10-13 NOTE — Telephone Encounter (Signed)
Met with Dr. Adele Schilder who authorized a one time refill of patient's prescribed Clonazepam be called into patient's [facility].  Called in new Clonazepam 0.71m, take one 1/2-1 tablet by mouth daily as needed for anxiety, #30, with no refills and no early refills.

## 2014-10-28 ENCOUNTER — Other Ambulatory Visit (HOSPITAL_COMMUNITY): Payer: Self-pay | Admitting: Psychiatry

## 2014-11-05 ENCOUNTER — Encounter (HOSPITAL_COMMUNITY): Payer: Self-pay | Admitting: Psychiatry

## 2014-11-05 ENCOUNTER — Ambulatory Visit (INDEPENDENT_AMBULATORY_CARE_PROVIDER_SITE_OTHER): Payer: 59 | Admitting: Psychiatry

## 2014-11-05 VITALS — BP 98/66 | HR 108 | Ht 61.0 in | Wt 113.0 lb

## 2014-11-05 DIAGNOSIS — F411 Generalized anxiety disorder: Secondary | ICD-10-CM

## 2014-11-05 MED ORDER — CLONAZEPAM 0.5 MG PO TABS: ORAL_TABLET | ORAL | Status: DC

## 2014-11-05 MED ORDER — VENLAFAXINE HCL ER 150 MG PO CP24: 150.0000 mg | ORAL_CAPSULE | Freq: Every day | ORAL | Status: DC
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
## 2014-11-09 ENCOUNTER — Ambulatory Visit (INDEPENDENT_AMBULATORY_CARE_PROVIDER_SITE_OTHER): Payer: 59 | Admitting: Psychology

## 2014-11-09 DIAGNOSIS — F411 Generalized anxiety disorder: Secondary | ICD-10-CM

## 2014-11-09 NOTE — Progress Notes (Signed)
   THERAPIST PROGRESS NOTE  Session Time: 8.05am-9.00am  Participation Level: Active  Behavioral Response: Well GroomedAlerttired  Type of Therapy: Individual Therapy  Treatment Goals addressed: Diagnosis: GAD and goal 1.  Interventions: CBT and Supportive  Summary: Hannah Castro is a 19 y.o. female who presents with report of feeling tired as early for her to be up and was out of town for fiance's family function.  Pt reported that she has been dropped from school earlier than expected as she thought she still had this week to get immunization records in.  However, pt reports she has run into problems tracking down documentation and didn't feel she was going to get in on time.  Pt reported disappointed but will be able to start in spring instead.  Pt acknowledge that having too much free time will not be good for her and so is thinking about ways can create routine for self.  Pt will be watching her fiance's nephew more.  Pt discussed challenges w/ parenting differences w/ her fiances parents and even how fiance has approached things.  Pt reports that she is able to express and communciate w/ fiance but w/ his parents doesn't seem accepted. Pt discussed how to assert the most important things along w/ her fiance.     Suicidal/Homicidal: Nowithout intent/plan  Therapist Response: Assessed pt current functioning per pt report.  Processed w/pt transition upcoming since unenrolled from [employer] this semester.  Encouraged pt to create new routine and structure for self w/ other activities.  Dicussed parenting challenges and communication and boundaries.    Plan: Return again in 2 weeks.  Diagnosis: GAD    YATES,LEANNE, [facility] 11/09/2014

## 2014-11-23 ENCOUNTER — Ambulatory Visit (HOSPITAL_COMMUNITY): Payer: Self-pay | Admitting: Psychology

## 2014-11-23 ENCOUNTER — Encounter (HOSPITAL_COMMUNITY): Payer: Self-pay | Admitting: Psychology

## 2014-11-23 NOTE — Progress Notes (Signed)
Hannah Castro is a 19 y.o. female patient who didn't show for her appointment.  Letter sent.        Forde Radon, LPC

## 2015-01-11 ENCOUNTER — Ambulatory Visit (INDEPENDENT_AMBULATORY_CARE_PROVIDER_SITE_OTHER): Payer: 59 | Admitting: Psychology

## 2015-01-11 DIAGNOSIS — F411 Generalized anxiety disorder: Secondary | ICD-10-CM

## 2015-01-11 NOTE — Progress Notes (Signed)
   THERAPIST PROGRESS NOTE  Session Time: 12.30pm-1.25pm  Participation Level: Active  Behavioral Response: Casual and Well GroomedAlertAnxious and Depressed  Type of Therapy: Individual Therapy  Treatment Goals addressed: Diagnosis: GAD and goal 1.  Interventions: CBT and Supportive  Summary: Hannah AmabileCorey E Castro is a 19 y.o. female who presents with report of feeling very anxious- affect was blunted- seemed more sullen.  Pt reported that she has not being doing much lately.  Pt reported apathy and reports needs to reenroll for school to start in spring semester- just hasn't.  Pt reported that she didn't feel well yesterday after staying in bed for 24 hours and not taking her meds. pt reported that she is eating and taking better self care today.  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 Pt acknowledged that she is so focused w/ this role that not doing much for self.  Pt acknowledged need for self care and agrees to set appropriate boundaries.     Suicidal/Homicidal: Nowithout intent/plan  Therapist Response: Assessed pt current functioning per pt report.  Explored w/pt her mood and interactions w/ family and coparenting challenges.  Reflected to pt lack of progress towards her personal goals and not focused on own self care and importance of balancing this w/ parenting role.    Plan: Return again in 2 weeks.  Diagnosis: GAD    Forde RadonYATES,[name], [facility] 01/11/2015

## 2015-01-22 ENCOUNTER — Other Ambulatory Visit (HOSPITAL_COMMUNITY): Payer: Self-pay | Admitting: Psychiatry

## 2015-01-26 ENCOUNTER — Other Ambulatory Visit (HOSPITAL_COMMUNITY): Payer: Self-pay | Admitting: Psychiatry

## 2015-02-03 ENCOUNTER — Ambulatory Visit (INDEPENDENT_AMBULATORY_CARE_PROVIDER_SITE_OTHER): Payer: 59 | Admitting: Psychology

## 2015-02-03 DIAGNOSIS — F411 Generalized anxiety disorder: Secondary | ICD-10-CM

## 2015-02-03 NOTE — Progress Notes (Signed)
   THERAPIST PROGRESS NOTE  Session Time: 12.32pm-1.28pm  Participation Level: Active  Behavioral Response: Well GroomedAlertAnxious  Type of Therapy: Individual Therapy  Treatment Goals addressed: Diagnosis: GAD and goal 1  Interventions: CBT and Supportive  Summary: Hannah Castro is a 19 y.o. female who presents with affect blunted. Pt reported she hasn't been up to much over past couple days as getting over a cold.  Pt reported that she hasn't been doing much towards her personal goals as she "should".  Pt does identify her goals for education and that needs to take steps towards- pt identifies barriers to be anxiety and things that she sees as pointless in the process. Pt acknowledges avoidance on her part and states next step is to contact tech support to regain access to register online.  Pt discussed her boyfriends want for contacting his biological parents and acknowledges her role just as support.  Pt discusses some worry that his expectations for this contact won't be met. 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  Pt discussed ways of building traditions together and want for increased connecting between boyfriend and his nephew.    Suicidal/Homicidal: Nowithout intent/plan  Therapist Response: Assessed pt current functioning per pt report.  Explored w/pt her followup on steps towards her education goals.  Assisted pt in identifying barriers and next steps.  Processed w/pt interactions w/ family, boyfriend and his family.  Discussed ways of starting traditions.   Plan: Return again in 2 weeks.  Diagnosis: GAD    Kitti Mcclish, [facility] 02/03/2015

## 2015-02-04 ENCOUNTER — Encounter (HOSPITAL_COMMUNITY): Payer: Self-pay | Admitting: Psychiatry

## 2015-02-04 ENCOUNTER — Ambulatory Visit (INDEPENDENT_AMBULATORY_CARE_PROVIDER_SITE_OTHER): Payer: 59 | Admitting: Psychiatry

## 2015-02-04 VITALS — BP 97/65 | HR 100 | Ht 61.0 in | Wt 119.4 lb

## 2015-02-04 DIAGNOSIS — F411 Generalized anxiety disorder: Secondary | ICD-10-CM

## 2015-02-04 MED ORDER — VENLAFAXINE HCL ER 150 MG PO CP24: 150.0000 mg | ORAL_CAPSULE | Freq: Every day | ORAL | Status: DC

## 2015-02-04 MED ORDER — CLONAZEPAM 0.5 MG PO TABS: ORAL_TABLET | ORAL | Status: DC
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
## 2015-02-16 ENCOUNTER — Ambulatory Visit (INDEPENDENT_AMBULATORY_CARE_PROVIDER_SITE_OTHER): Payer: 59 | Admitting: Psychology

## 2015-02-16 DIAGNOSIS — F411 Generalized anxiety disorder: Secondary | ICD-10-CM | POA: Diagnosis not present

## 2015-02-16 NOTE — Progress Notes (Signed)
   THERAPIST PROGRESS NOTE  Session Time: 12.23pm-1.25pm  Participation Level: Active  Behavioral Response: Well GroomedAlertTired  Type of Therapy: Individual Therapy  Treatment Goals addressed: Diagnosis: GAD, goal 1.  Interventions: CBT and Motivational Interviewing  Summary: Hannah Castro is a 19 y.o. female who presents with report of feeling tired- not sleeping well at night.  Pt reported that she enjoyed Christmas and the excitement of the child she is helping caretake. Pt reported also stressors of interactions re: parenting in the same house as grandparents w/out clear boundaries, routines.  Pt also reported stressor of frustrations of wanting boyfriend to take more time parenting interactions.  Pt expressed feeling helpless to make change with some of these things.  Pt identified her wants for self as well to have some free time and focus on her own health.  Pt increased awareness of need to make priority, plan for and follow through w/ her boyfriend to make change.   Suicidal/Homicidal: Nowithout intent/plan  Therapist Response: Assessed pt current functioning per pt report.  Processed w/pt interactions w/ family, boyfriend and his family.  Explored w/ pt her frustrations and explored w/ pt her wants- what she has control over and what doesn't.  Explored w/ pt barriers and what steps she needs to make to work towards creating changing.   Plan: Return again in 2 weeks.  Diagnosis: GAD    Forde RadonYATES,Lorrin Nawrot, [facility] 02/16/2015

## 2015-03-02 ENCOUNTER — Ambulatory Visit (INDEPENDENT_AMBULATORY_CARE_PROVIDER_SITE_OTHER): Payer: 59 | Admitting: Psychology

## 2015-03-02 DIAGNOSIS — F411 Generalized anxiety disorder: Secondary | ICD-10-CM

## 2015-03-02 NOTE — Progress Notes (Signed)
   THERAPIST PROGRESS NOTE  Session Time: 12.40pm-1.25pm  Participation Level: Active  Behavioral Response: CasualAlertAnxious  Type of Therapy: Individual Therapy  Treatment Goals addressed: Diagnosis: GAD and goal 1.  Interventions: CBT and Supportive  Summary: [name] is a 20 y.o. female who presents with affect WNL.  Pt reported stressors of interactions w/ boyfriend's mom and arranging for caregiving.   Pt reported that she isn't sleeping well w/ boyfriend's snoring and has had several nights of interrupted sleep.  Pt reported that she is going to sleep at her house tonight and boyfriend supports her need for rest. Pt reported that school starts next week and she has a hold on registration as [employer] needs high school transcripts which pt reports have already been sent. Pt expresses frustration w/ the process and difficult as doesn't really even like [employer] just feels that is her option at home to complete her schooling.  Pt is able to identify that although larger classes and larger campus and student population she can seek close interactions w/ professors if chooses. Pt acknowledges need for self care- eating, sleeping, take care of stress. Pt is excited to report less nail biting- nails growing out.   Suicidal/Homicidal: Nowithout intent/plan  Therapist Response: Assessed pt current functioning per pt report.  Processed w/pt her interactions that have been stressful.   Explored w/pt her self care and lack of focus on self and need for coping and helping to support others.  Discussed w/pt ways to focus on what she can create for self and experience she is looking for.    Plan: Return again in 2 weeks.  Diagnosis: GAD    YATES,LEANNE, LPC 03/02/2015

## 2015-03-29 ENCOUNTER — Ambulatory Visit (HOSPITAL_COMMUNITY): Payer: Self-pay | Admitting: Psychology

## 2015-03-31 ENCOUNTER — Ambulatory Visit (INDEPENDENT_AMBULATORY_CARE_PROVIDER_SITE_OTHER): Payer: 59 | Admitting: Psychology

## 2015-03-31 DIAGNOSIS — F411 Generalized anxiety disorder: Secondary | ICD-10-CM

## 2015-03-31 NOTE — Progress Notes (Signed)
   THERAPIST PROGRESS NOTE  Session Time: 2.30pm-3.27pm Participation Level: Active  Behavioral Response: Well GroomedAlertAnxious  Type of Therapy: Individual Therapy  Treatment Goals addressed: Diagnosis: GAD and goal 1.  Interventions: CBT, Supportive and Reframing  Summary: Hannah Castro is a 20 y.o. female who presents with anxious mood.  Pt reports that she is 3 weeks into school and has been feeling anxious past several days.  Pt reports she is overwhelmed trying to find the balance of school and being mom.  Pt reported on her school stressors and recognizing classes she needs to seek tutoring or extra studying. Pt identifies that she is needs to come up w/ better structure for her week- to manage her time better and meet her goals.  Pt discussed need to sit down and write out a schedule. Pt does report that her boyfriend has been very supportive and that she is spending more time at home which is helping.  Pt reports that was helpful to come in, talk about stressors, get insight for self into how she is going to manage and a plan to approach.    Suicidal/Homicidal: Nowithout intent/plan  Therapist Response: Assessed pt current functioning per pt report.  Processed w/ pt her transition to [facility] and stressors she is experiencing.  Had pt focus on her coping skills- identifying barriers and identifying plan for better time management.  Assisted also in reframing negative self talk about guilt- being realistic w/ her self and acknowledging how still present to others as well as self care. .  Plan: Return again in 2 weeks.  Diagnosis: GAD   Castiel Lauricella, LPC 03/31/2015

## 2015-04-05 ENCOUNTER — Telehealth (HOSPITAL_COMMUNITY): Payer: Self-pay

## 2015-04-05 NOTE — Telephone Encounter (Signed)
Telephone call with Elita Quick, pharmacist at [facility] after receiving a faxed refill request for Klonopin to inform patient was given a prescription on 02/04/15 plus 2 refills.  Collateral agreed to contact patient to follow up.

## 2015-04-12 ENCOUNTER — Ambulatory Visit (HOSPITAL_COMMUNITY): Payer: Self-pay | Admitting: Psychology

## 2015-04-12 ENCOUNTER — Encounter (HOSPITAL_COMMUNITY): Payer: Self-pay | Admitting: Psychology

## 2015-04-14 ENCOUNTER — Encounter (HOSPITAL_COMMUNITY): Payer: Self-pay | Admitting: Psychology

## 2015-04-14 NOTE — Progress Notes (Signed)
Hannah Castro is a 20 y.o. female patient who didn't show for her appointment.  Letter sent.        Forde Radon, LPC

## 2015-04-26 ENCOUNTER — Ambulatory Visit (HOSPITAL_COMMUNITY): Payer: Self-pay | Admitting: Psychology

## 2015-05-05 ENCOUNTER — Ambulatory Visit (HOSPITAL_COMMUNITY): Payer: Self-pay | Admitting: Psychiatry

## 2015-05-27 ENCOUNTER — Ambulatory Visit (HOSPITAL_COMMUNITY): Payer: Self-pay | Admitting: Psychiatry

## 2015-06-03 ENCOUNTER — Ambulatory Visit (INDEPENDENT_AMBULATORY_CARE_PROVIDER_SITE_OTHER): Payer: 59 | Admitting: Psychiatry

## 2015-06-03 ENCOUNTER — Encounter (HOSPITAL_COMMUNITY): Payer: Self-pay | Admitting: Psychiatry

## 2015-06-03 VITALS — BP 118/64 | HR 116 | Ht 61.0 in | Wt 125.0 lb

## 2015-06-03 DIAGNOSIS — F411 Generalized anxiety disorder: Secondary | ICD-10-CM | POA: Diagnosis not present

## 2015-06-03 MED ORDER — CLONAZEPAM 0.5 MG PO TABS: ORAL_TABLET | ORAL | Status: DC

## 2015-06-03 MED ORDER — VENLAFAXINE HCL ER 150 MG PO CP24: 150.0000 mg | ORAL_CAPSULE | Freq: Every day | ORAL | Status: DC
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
## 2015-08-18 ENCOUNTER — Encounter (HOSPITAL_COMMUNITY): Payer: Self-pay | Admitting: Psychiatry

## 2015-08-19 NOTE — Telephone Encounter (Signed)
Error in opening

## 2015-08-25 ENCOUNTER — Other Ambulatory Visit (HOSPITAL_COMMUNITY): Payer: Self-pay | Admitting: Psychiatry

## 2015-08-27 ENCOUNTER — Other Ambulatory Visit (HOSPITAL_COMMUNITY): Payer: Self-pay | Admitting: Psychiatry

## 2015-08-27 DIAGNOSIS — F411 Generalized anxiety disorder: Secondary | ICD-10-CM

## 2015-08-27 MED ORDER — VENLAFAXINE HCL ER 150 MG PO CP24: 150.0000 mg | ORAL_CAPSULE | Freq: Every day | ORAL | Status: DC

## 2015-08-31 ENCOUNTER — Telehealth (HOSPITAL_COMMUNITY): Payer: Self-pay

## 2015-08-31 DIAGNOSIS — F411 Generalized anxiety disorder: Secondary | ICD-10-CM

## 2015-08-31 MED ORDER — CLONAZEPAM 0.5 MG PO TABS: ORAL_TABLET | ORAL | Status: DC

## 2015-08-31 NOTE — Telephone Encounter (Signed)
Dr. Ladona Ridgelaylor approved a one month refill on patients Klonopin, this was called into [facility]

## 2015-09-08 ENCOUNTER — Ambulatory Visit (HOSPITAL_COMMUNITY): Payer: Self-pay | Admitting: Psychiatry

## 2015-09-28 ENCOUNTER — Encounter (HOSPITAL_COMMUNITY): Payer: Self-pay | Admitting: Psychology

## 2015-09-28 DIAGNOSIS — F411 Generalized anxiety disorder: Secondary | ICD-10-CM

## 2015-09-28 NOTE — Progress Notes (Signed)
Hannah Castro Trieu is a 20 y.o. female patient discharged from counseling as last seen 03/31/15.  Outpatient Therapist Discharge Summary  Hannah Castro Rought    06/24/1995   Admission Date: 04/24/14   Discharge Date:  09/28/15  Reason for Discharge:  Not active with counseling Diagnosis:    Generalized anxiety disorder    Comments:  Pt will continue w/ Dr. Lolly MustacheArfeen as scheduled.   Alfredo BattyLeanne M Yates          YATES,LEANNE, LPC

## 2015-11-03 ENCOUNTER — Ambulatory Visit (HOSPITAL_COMMUNITY): Payer: Self-pay | Admitting: Psychiatry
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
## 2015-11-17 ENCOUNTER — Ambulatory Visit (HOSPITAL_COMMUNITY): Payer: Self-pay | Admitting: Psychiatry
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
## 2015-12-01 ENCOUNTER — Ambulatory Visit (INDEPENDENT_AMBULATORY_CARE_PROVIDER_SITE_OTHER): Payer: 59 | Admitting: Psychiatry

## 2015-12-01 DIAGNOSIS — F411 Generalized anxiety disorder: Secondary | ICD-10-CM | POA: Diagnosis not present

## 2015-12-01 MED ORDER — VENLAFAXINE HCL ER 37.5 MG PO CP24: ORAL_CAPSULE | ORAL | 2 refills | Status: DC

## 2015-12-01 MED ORDER — VENLAFAXINE HCL ER 150 MG PO CP24: ORAL_CAPSULE | ORAL | 2 refills | Status: DC

## 2015-12-01 MED ORDER — CLONAZEPAM 0.5 MG PO TABS: ORAL_TABLET | ORAL | 1 refills | Status: DC

## 2015-12-01 NOTE — Patient Instructions (Signed)
1. Increase Effexor 187.5 mg daily for two weeks, then 225 mg daily 2. Would advise to schedule with a therapist 3. Return to clinic in one month

## 2015-12-30 ENCOUNTER — Ambulatory Visit (INDEPENDENT_AMBULATORY_CARE_PROVIDER_SITE_OTHER): Payer: 59 | Admitting: Psychology

## 2015-12-30 DIAGNOSIS — F411 Generalized anxiety disorder: Secondary | ICD-10-CM | POA: Diagnosis not present
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
## 2016-01-03 ENCOUNTER — Ambulatory Visit (INDEPENDENT_AMBULATORY_CARE_PROVIDER_SITE_OTHER): Payer: 59 | Admitting: Licensed Clinical Social Worker

## 2016-01-03 ENCOUNTER — Encounter (HOSPITAL_COMMUNITY): Payer: Self-pay | Admitting: Licensed Clinical Social Worker

## 2016-01-03 DIAGNOSIS — F411 Generalized anxiety disorder: Secondary | ICD-10-CM | POA: Diagnosis not present

## 2016-01-03 NOTE — Progress Notes (Signed)
   THERAPIST PROGRESS NOTE  Session Time: 1:10- 2pm Participation Level: Active  Behavioral Response: Well GroomedAlertAnxious  Type of Therapy: Individual Therapy  Interventions: CBT, Supportive and Reframing  Summary: Hannah Castro is a 20 y.o. female who presents with anxious mood. Today is the first therapy session with pt. [facility] The pt wanted a therapist more directive. Mainly processed with pt on gaining trust in therapy. Pt anxiously gave a hx of what is the main stressor in her life currently: her "son" who is not her son but she has helped her xboyfriend raise him for the past 2.5 years. Next stressor: Her relationship with her x boyfriend, her lack of self-esteem.Pt was open to a new therapist and looks forward to  talking about stressors, get insight for self into how she is going to manage and a plan to approach.    Suicidal/Homicidal: Nowithout intent/plan  Therapist Response: Assessed pt current functioning per pt report and for a baseline. Processed with pt the importance of trust and honesty in a therapeutic relationship. Plan: Return again in 2 weeks.  Diagnosis: GAD   Hannah Castro S, LCAS 01/03/2016

## 2016-01-19 ENCOUNTER — Telehealth (HOSPITAL_COMMUNITY): Payer: Self-pay

## 2016-01-19 ENCOUNTER — Ambulatory Visit (HOSPITAL_COMMUNITY): Payer: 59 | Admitting: Licensed Clinical Social Worker

## 2016-01-19 DIAGNOSIS — F411 Generalized anxiety disorder: Secondary | ICD-10-CM

## 2016-01-19 NOTE — Telephone Encounter (Signed)
Patient is pregnant.  Recommended to stop Klonopin and taper Effexor to take every other day for few days and then stop.  She need to be seen as soon when she gets appointment.

## 2016-01-19 NOTE — Telephone Encounter (Signed)
Medication management - Patient in to see Hannah Castro this date and reports she is 7 and 1/[redacted] weeks pregnant and questions should she stop medications. Agreed to discuss with Dr. Lolly Castro scheduled to see patient next on 02/17/16.  Dr. Lolly Castro requested patient stop Klonopin and taper off Effexor XR by taking 150 mg tomorrow and then every other day for 3 more pills and then stop medication.  Patient to reschedule first available with Dr. Lolly Castro or Dr.Hisada per Dr. Lolly Castro so arranged for next appointment with Dr. Vanetta Castro for 02/07/16.  Patient agreed with medication taper plan and stated it back to this writer.  Patient to call if any problems with stopping of medication as discussed she may not feel as well with change and discussed blood pressure and syncope possibilities with changes.  Patient to call as needed, particularly with mood changes or problems with taper, and agreed to keep new appointment set for 02/07/16.

## 2016-01-20 ENCOUNTER — Encounter (HOSPITAL_COMMUNITY): Payer: Self-pay | Admitting: Licensed Clinical Social Worker
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
## 2016-01-26 ENCOUNTER — Telehealth (HOSPITAL_COMMUNITY): Payer: Self-pay

## 2016-01-26 ENCOUNTER — Telehealth (HOSPITAL_COMMUNITY): Payer: Self-pay | Admitting: Psychiatry

## 2016-01-26 MED ORDER — VENLAFAXINE HCL ER 37.5 MG PO CP24: ORAL_CAPSULE | ORAL | 0 refills | Status: DC

## 2016-01-26 NOTE — Telephone Encounter (Signed)
Patient is calling with a question about her reaction to coming off of the Effexor. She states that she is in the bed, she is throwing up and feels awful. She would like to know if there is anything she can do to feel better - she states she can not be this sick and pregnant too. Please review and advise, thank you

## 2016-01-26 NOTE — Telephone Encounter (Signed)
Patient was taking Effexor 150 mg every other day for three days and discontinue the medication as per direction from Dr. Lolly MustacheArfeen. (Patient did not tolerate 225 mg as it caused her "withdrawal" of staying in the bed)  She states that it has been difficult to get out of the bed or eat since she discontinued medication. She feels depressed and has crying spells. She endorses vertigo, nausea and feels that her eye is twitching. She has "weird time perception" which is difficult for her to describe.   She is likely having withdrawal symptoms from Effexor. Discussed risk of effexor during pregnancy, which includes teratogenic risk (with animal studies) and spontaneous abortion. Given her current symptoms, it is considered that restarting her medication would outweigh its risk. Will restart Effexor.   - Start Effexor 37.5 mg for three days then 75 mg daily - Patient to contact the clinic if worsening in her symptoms - Patient may also go to urgent care if her physical symptoms gets worse.

## 2016-01-26 NOTE — Telephone Encounter (Signed)
Discussed with patient, will restart Effexor. Details in the note.

## 2016-02-02 ENCOUNTER — Ambulatory Visit (INDEPENDENT_AMBULATORY_CARE_PROVIDER_SITE_OTHER): Payer: 59 | Admitting: Licensed Clinical Social Worker

## 2016-02-02 ENCOUNTER — Encounter (HOSPITAL_COMMUNITY): Payer: Self-pay | Admitting: Licensed Clinical Social Worker

## 2016-02-02 ENCOUNTER — Encounter: Payer: Self-pay | Admitting: Obstetrics and Gynecology

## 2016-02-02 DIAGNOSIS — F411 Generalized anxiety disorder: Secondary | ICD-10-CM | POA: Diagnosis not present

## 2016-02-02 NOTE — Progress Notes (Signed)
   THERAPIST PROGRESS NOTE  Session Time: 1:10- 1:45pm Participation Level: Active  Behavioral Response: Well GroomedAlertAnxious  Type of Therapy: Individual Therapy  Interventions: CBT, Supportive and Reframing  Summary: Hannah Castro is a 20 y.o. female who presents with anxious mood. Pt presented sleepy and not willing to participate in a therapy session. Pt is [redacted] weeks pregnant and now doesn't want the baby. She came in about a week ago and was excited about the baby. She didn't want to talk about the baby or other issues surrounding her current situation. Pt was not willing to engage.Gave pt homework assignments: make a list of what to use her time in therapy, journal about her situation, mediation for anxiety. Suicidal/Homicidal: Nowithout intent/plan  Therapist Response: Assessed pt current functioning. Processed with pt coping skills for anxiety.Plan: Return again in 2 weeks.  Diagnosis: GAD   MACKENZIE,LISBETH S, LCAS 02/02/2016
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
## 2016-02-07 ENCOUNTER — Ambulatory Visit (HOSPITAL_COMMUNITY): Payer: Self-pay | Admitting: Psychiatry

## 2016-02-17 ENCOUNTER — Ambulatory Visit (HOSPITAL_COMMUNITY): Payer: Self-pay | Admitting: Psychiatry

## 2016-02-28 ENCOUNTER — Ambulatory Visit (HOSPITAL_COMMUNITY): Payer: Self-pay | Admitting: Licensed Clinical Social Worker

## 2016-02-28 NOTE — Progress Notes (Unsigned)
   THERAPIST PROGRESS NOTE  Session Time: 1:10- 1:45pm Participation Level: Active  Behavioral Response: Well GroomedAlertAnxious  Type of Therapy: Individual Therapy  Interventions: CBT, Supportive and Reframing  Summary: Hannah Castro is a 21 y.o. female who presents with anxious mood. Pt presented sleepy and not willing to participate in a therapy session. Pt is [redacted] weeks pregnant and now doesn't want the baby. She came in about a week ago and was excited about the baby. She didn't want to talk about the baby or other issues surrounding her current situation. Pt was not willing to engage.Gave pt homework assignments: make a list of what to use her time in therapy, journal about her situation, mediation for anxiety. Suicidal/Homicidal: Nowithout intent/plan  Therapist Response: Assessed pt current functioning. Processed with pt coping skills for anxiety.Plan: Return again in 2 weeks.  Diagnosis: GAD   Faithe Ariola S, LCAS 02/28/2016

## 2016-03-21 ENCOUNTER — Other Ambulatory Visit (HOSPITAL_COMMUNITY): Payer: Self-pay

## 2016-03-21 MED ORDER — VENLAFAXINE HCL ER 37.5 MG PO CP24: ORAL_CAPSULE | ORAL | 0 refills | Status: DC

## 2016-03-25 HISTORY — PX: DILATION AND EVACUATION: SHX1459

## 2016-04-03 ENCOUNTER — Ambulatory Visit (INDEPENDENT_AMBULATORY_CARE_PROVIDER_SITE_OTHER): Payer: 59 | Admitting: Psychiatry

## 2016-04-03 ENCOUNTER — Encounter (HOSPITAL_COMMUNITY): Payer: Self-pay | Admitting: Psychiatry

## 2016-04-03 VITALS — BP 110/70 | HR 90 | Ht 61.0 in | Wt 115.6 lb

## 2016-04-03 DIAGNOSIS — Z79899 Other long term (current) drug therapy: Secondary | ICD-10-CM | POA: Diagnosis not present

## 2016-04-03 DIAGNOSIS — F411 Generalized anxiety disorder: Secondary | ICD-10-CM

## 2016-04-03 DIAGNOSIS — F331 Major depressive disorder, recurrent, moderate: Secondary | ICD-10-CM | POA: Diagnosis not present
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
## 2016-04-03 NOTE — Patient Instructions (Signed)
1. The next option for your medication would be Mirtazapine.  2. Return to clinic in one month

## 2016-05-08 ENCOUNTER — Ambulatory Visit (HOSPITAL_COMMUNITY): Payer: Self-pay | Admitting: Psychiatry
